# Patient Record
Sex: Female | Born: 1975 | Race: White | Hispanic: No | Marital: Married | Smoking: Never smoker
Health system: Southern US, Community
[De-identification: ages and names within clinical notes are randomized; demographics above are authoritative.]

---

## 2017-08-25 ENCOUNTER — Emergency Department (HOSPITAL_COMMUNITY): Payer: Medicaid Other

## 2017-08-25 ENCOUNTER — Encounter (HOSPITAL_COMMUNITY): Payer: Self-pay | Admitting: Emergency Medicine

## 2017-08-25 ENCOUNTER — Emergency Department (HOSPITAL_COMMUNITY)
Admission: EM | Admit: 2017-08-25 | Discharge: 2017-08-25 | Disposition: A | Discharge status: Home / Self Care | Payer: Medicaid Other | Attending: Emergency Medicine | Admitting: Emergency Medicine

## 2017-08-25 DIAGNOSIS — R1032 Left lower quadrant pain: Secondary | ICD-10-CM | POA: Diagnosis present

## 2017-08-25 DIAGNOSIS — N839 Noninflammatory disorder of ovary, fallopian tube and broad ligament, unspecified: Secondary | ICD-10-CM | POA: Diagnosis not present

## 2017-08-25 DIAGNOSIS — N838 Other noninflammatory disorders of ovary, fallopian tube and broad ligament: Secondary | ICD-10-CM

## 2017-08-25 LAB — BASIC METABOLIC PANEL
ANION GAP: 12 (ref 5–15)
BUN: 9 mg/dL (ref 6–20)
CALCIUM: 9 mg/dL (ref 8.9–10.3)
CHLORIDE: 102 mmol/L (ref 101–111)
CO2: 25 mmol/L (ref 22–32)
CREATININE: 0.83 mg/dL (ref 0.44–1.00)
GFR calc Af Amer: >60 mL/min (ref >60)
GFR calc non Af Amer: >60 mL/min (ref >60)
GLUCOSE: 104 mg/dL — AB (ref 65–99)
Potassium: 3.8 mmol/L (ref 3.5–5.1)
Sodium: 139 mmol/L (ref 135–145)

## 2017-08-25 LAB — CBC WITH DIFFERENTIAL/PLATELET
Basophils Absolute: 0 10*3/uL (ref 0.0–0.1)
Basophils Relative: 0 %
Eosinophils Absolute: 0 10*3/uL (ref 0.0–0.7)
Eosinophils Relative: 0 %
HEMATOCRIT: 37.5 % (ref 36.0–46.0)
Hemoglobin: 11.7 g/dL — ABNORMAL LOW (ref 12.0–15.0)
LYMPHS PCT: 13 %
Lymphs Abs: 1.4 10*3/uL (ref 0.7–4.0)
MCH: 26.9 pg (ref 26.0–34.0)
MCHC: 31.2 g/dL (ref 30.0–36.0)
MCV: 86.2 fL (ref 78.0–100.0)
MONO ABS: 0.7 10*3/uL (ref 0.1–1.0)
MONOS PCT: 7 %
NEUTROS ABS: 8.5 10*3/uL — AB (ref 1.7–7.7)
Neutrophils Relative %: 80 %
Platelets: 378 10*3/uL (ref 150–400)
RBC: 4.35 MIL/uL (ref 3.87–5.11)
RDW: 14.3 % (ref 11.5–15.5)
WBC: 10.6 10*3/uL — ABNORMAL HIGH (ref 4.0–10.5)

## 2017-08-25 LAB — URINALYSIS, ROUTINE W REFLEX MICROSCOPIC
BACTERIA UA: NONE SEEN
Bilirubin Urine: NEGATIVE
Glucose, UA: NEGATIVE mg/dL
KETONES UR: NEGATIVE mg/dL
LEUKOCYTES UA: NEGATIVE
Nitrite: NEGATIVE
PH: 8 (ref 5.0–8.0)
Protein, ur: NEGATIVE mg/dL
Specific Gravity, Urine: 1.01 (ref 1.005–1.030)

## 2017-08-25 LAB — PREGNANCY, URINE: PREG TEST UR: NEGATIVE

## 2017-08-25 MED ORDER — ONDANSETRON HCL 4 MG PO TABS
4.0000 mg | ORAL_TABLET | Freq: Once | ORAL | Status: AC
  Administered 2017-08-25: 4 mg via ORAL
  Filled 2017-08-25: qty 1

## 2017-08-25 MED ORDER — TRAMADOL HCL 50 MG PO TABS: 50.0000 mg | ORAL_TABLET | Freq: Four times a day (QID) | ORAL | 0 refills | Status: DC | PRN

## 2017-08-25 MED ORDER — ONDANSETRON HCL 4 MG PO TABS: 4.0000 mg | ORAL_TABLET | Freq: Three times a day (TID) | ORAL | 0 refills | Status: DC | PRN

## 2017-08-25 MED ORDER — SODIUM CHLORIDE 0.9 % IV BOLUS (SEPSIS)
1000.0000 mL | Freq: Once | INTRAVENOUS | Status: AC
  Administered 2017-08-25: 1000 mL via INTRAVENOUS

## 2017-08-25 MED ORDER — TRAMADOL HCL 50 MG PO TABS
50.0000 mg | ORAL_TABLET | Freq: Once | ORAL | Status: AC
  Administered 2017-08-25: 50 mg via ORAL
  Filled 2017-08-25: qty 1

## 2017-08-25 NOTE — ED Triage Notes (Addendum)
Patient complaining of fever x 1 1/2 weeks and left flank pain since yesterday. Denies dysuria. Also complaining of menstrual period lasting longer than 1 week. Denies pain. States she went to [facility] and they took x-rays of abdomen. Patient has radiology disc with her.

## 2017-08-25 NOTE — Discharge Instructions (Signed)
Follow-up Monday to get your pelvic ultrasound as planned.  Then follow-up this week with your OB/GYN doctor.  Call your doctor this week to set up the appointment and tell them you are in the emergency department today we felt like you need to see them this week

## 2017-08-25 NOTE — ED Provider Notes (Signed)
[facility] EMERGENCY DEPARTMENT Provider Note   CSN: [id number] Arrival date & time: 08/25/17  1855     History   Chief Complaint Chief Complaint  Patient presents with  . Fever    HPI Barbara Ho is a 42 y.o. female.  Patient presents with feeling bad for about a week and a half some fever some nausea some left lower quadrant abdominal pain   The history is provided by the patient. No language interpreter was used.  Illness  This is a new problem. The current episode started more than 1 week ago. The problem occurs constantly. The problem has not changed since onset.Associated symptoms include abdominal pain. Pertinent negatives include no chest pain and no headaches. Nothing aggravates the symptoms. Nothing relieves the symptoms. She has tried nothing for the symptoms. The treatment provided no relief.    History reviewed. No pertinent past medical history.  There are no active problems to display for this patient.   History reviewed. No pertinent surgical history.  OB History    No data available       Home Medications    Prior to Admission medications   Medication Sig Start Date End Date Taking? Authorizing Provider  traMADol (ULTRAM) 50 MG tablet Take 1 tablet (50 mg total) by mouth every 6 (six) hours as needed. 08/25/17   Milton Ferguson, MD    Family History No family history on file.  Social History Social History   Tobacco Use  . Smoking status: Never Smoker  . Smokeless tobacco: Never Used  Substance Use Topics  . Alcohol use: No    Frequency: Never  . Drug use: No     Allergies   Patient has no allergy information on record.   Review of Systems Review of Systems  Constitutional: Negative for appetite change and fatigue.  HENT: Negative for congestion, ear discharge and sinus pressure.   Eyes: Negative for discharge.  Respiratory: Negative for cough.   Cardiovascular: Negative for chest pain.  Gastrointestinal: Positive for abdominal  pain. Negative for diarrhea.  Genitourinary: Negative for frequency and hematuria.  Musculoskeletal: Negative for back pain.  Skin: Negative for rash.  Neurological: Negative for seizures and headaches.  Psychiatric/Behavioral: Negative for hallucinations.     Physical Exam Updated Vital Signs BP 114/89 (BP Location: Right Arm)   Pulse 80   Temp 98.8 F (37.1 C) (Oral)   Resp 18   Ht 5\' 8"  (1.727 m)   Wt 59.9 kg (132 lb)   LMP 08/15/2017 Comment: patient recently had testing done x1 day agol..  per dr Marcille Barman patient is ok for exam negative test results  SpO2 97%   BMI 20.07 kg/m   Physical Exam  Constitutional: She is oriented to person, place, and time. She appears well-developed.  HENT:  Head: Normocephalic.  Eyes: Conjunctivae and EOM are normal. No scleral icterus.  Neck: Neck supple. No thyromegaly present.  Cardiovascular: Normal rate and regular rhythm. Exam reveals no gallop and no friction rub.  No murmur heard. Pulmonary/Chest: No stridor. She has no wheezes. She has no rales. She exhibits no tenderness.  Abdominal: She exhibits no distension. There is tenderness. There is no rebound.  Tender left lower quadrant  Musculoskeletal: Normal range of motion. She exhibits no edema.  Lymphadenopathy:    She has no cervical adenopathy.  Neurological: She is oriented to person, place, and time. She exhibits normal muscle tone. Coordination normal.  Skin: No rash noted. No erythema.  Psychiatric: She has a  normal mood and affect. Her behavior is normal.     ED Treatments / Results  Labs (all labs ordered are listed, but only abnormal results are displayed) Labs Reviewed  CBC WITH DIFFERENTIAL/PLATELET - Abnormal; Notable for the following components:      Result Value   WBC 10.6 (*)    Hemoglobin 11.7 (*)    Neutro Abs 8.5 (*)    All other components within normal limits  BASIC METABOLIC PANEL - Abnormal; Notable for the following components:   Glucose, Bld 104  (*)    All other components within normal limits  URINALYSIS, ROUTINE W REFLEX MICROSCOPIC - Abnormal; Notable for the following components:   Hgb urine dipstick LARGE (*)    Squamous Epithelial / LPF 0-5 (*)    All other components within normal limits  PREGNANCY, URINE    EKG  EKG Interpretation None       Radiology Ct Renal Stone Study  Result Date: 08/25/2017 CLINICAL DATA:  42 year old female with history of fever for 1 and half weeks and left-sided flank pain for the past 24 hours. No dysuria. EXAM: CT ABDOMEN AND PELVIS WITHOUT CONTRAST TECHNIQUE: Multidetector CT imaging of the abdomen and pelvis was performed following the standard protocol without IV contrast. COMPARISON:  None. FINDINGS: Lower chest: Trace bilateral pleural effusions lying dependently. Hepatobiliary: No definite cystic or solid hepatic lesions confidently identified on today's noncontrast CT examination. Unenhanced appearance of the gallbladder is normal. Pancreas: No definite pancreatic mass or peripancreatic fluid or inflammatory changes are noted on today's noncontrast CT examination. Spleen: Unremarkable. Adrenals/Urinary Tract: Tiny 2 mm nonobstructive calculi in the upper pole the left kidney and in the lower pole collecting system of the right kidney. No ureteral stones or bladder stones identified. Despite this, there is moderate left hydroureteronephrosis, which appears related to extrinsic compression on the distal third of the left ureter related to a large left adnexal mass (discussed below). Unenhanced appearance of the kidneys is otherwise unremarkable. Bilateral adrenal glands are normal in appearance. Urinary bladder is normal in appearance. Stomach/Bowel: Unenhanced appearance of the stomach is normal. No pathologic dilatation of small bowel or colon. Normal appendix. Vascular/Lymphatic: No atherosclerotic calcifications in the abdominal or pelvic vasculature. Multiple prominent borderline enlarged and  mildly enlarged left pelvic and retroperitoneal lymph nodes, largest of which is in the left para-aortic nodal station measuring 1 cm in short axis (axial image 29 of series 2). Reproductive: Uterus is retroverted. Right ovary is unremarkable in appearance. In the left adnexal region there is a large multilocular mass measuring 6.4 x 8.4 x 9.4 cm (axial image 61 of series 2 and coronal image 47 of series 5). This lesion appears to have thickened walls in a thick internal septation with a small amount of calcifications. Other: No significant volume of ascites.  No pneumoperitoneum. Musculoskeletal: Small Tarlov cyst in the right side of the sacrum incidentally noted. There are no aggressive appearing lytic or blastic lesions noted in the visualized portions of the skeleton. IMPRESSION: 1. 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 This is exerting significant mass effect upon the distal third of the left ureter resulting in moderate proximal left hydroureteronephrosis. Further evaluation with pelvic ultrasound is strongly recommended to assess for potential ovarian malignancy. 2. Nonobstructive calculi in the collecting systems of both kidneys measuring up to 2 mm in the lower pole of the right kidney and upper pole the left kidney. No ureteral stones. Electronically Signed   By: Quillian Quince  Entrikin M.D.   On: 08/25/2017 22:00    Procedures Procedures (including critical care time)  Medications Ordered in ED Medications  sodium chloride 0.9 % bolus 1,000 mL (1,000 mLs Intravenous New Bag/Given 08/25/17 2126)     Initial Impression / Assessment and Plan / ED Course  I have reviewed the triage vital signs and the nursing notes.  Pertinent labs & imaging results that were available during my care of the patient were reviewed by me and considered in my medical decision making (see chart for details).     Patient CT scan shows large ovarian mass.  On the left.  Patient already has  arrangements to get an ultrasound in 2 days and she will follow-up with her OB/GYN doctor this week.  Patient given some pain medicine to take in the meantime  Final Clinical Impressions(s) / ED Diagnoses   Final diagnoses:  Ovarian mass, left    ED Discharge Orders        Ordered    traMADol (ULTRAM) 50 MG tablet  Every 6 hours PRN     08/25/17 2215       Milton Ferguson, MD 08/25/17 2223

## 2018-04-26 ENCOUNTER — Emergency Department (HOSPITAL_COMMUNITY): Payer: Self-pay

## 2018-04-26 ENCOUNTER — Other Ambulatory Visit: Payer: Self-pay

## 2018-04-26 ENCOUNTER — Encounter (HOSPITAL_COMMUNITY): Payer: Self-pay

## 2018-04-26 ENCOUNTER — Emergency Department (HOSPITAL_COMMUNITY)
Admission: EM | Admit: 2018-04-26 | Discharge: 2018-04-26 | Disposition: A | Discharge status: Home / Self Care | Payer: Self-pay | Attending: Emergency Medicine | Admitting: Emergency Medicine

## 2018-04-26 DIAGNOSIS — S7012XA Contusion of left thigh, initial encounter: Secondary | ICD-10-CM

## 2018-04-26 DIAGNOSIS — S298XXA Other specified injuries of thorax, initial encounter: Secondary | ICD-10-CM

## 2018-04-26 DIAGNOSIS — S161XXA Strain of muscle, fascia and tendon at neck level, initial encounter: Secondary | ICD-10-CM

## 2018-04-26 DIAGNOSIS — S3991XA Unspecified injury of abdomen, initial encounter: Secondary | ICD-10-CM

## 2018-04-26 DIAGNOSIS — M549 Dorsalgia, unspecified: Secondary | ICD-10-CM

## 2018-04-26 DIAGNOSIS — Y9389 Activity, other specified: Secondary | ICD-10-CM | POA: Insufficient documentation

## 2018-04-26 DIAGNOSIS — D259 Leiomyoma of uterus, unspecified: Secondary | ICD-10-CM

## 2018-04-26 DIAGNOSIS — S060X1A Concussion with loss of consciousness of 30 minutes or less, initial encounter: Secondary | ICD-10-CM

## 2018-04-26 DIAGNOSIS — R200 Anesthesia of skin: Secondary | ICD-10-CM | POA: Insufficient documentation

## 2018-04-26 DIAGNOSIS — Y998 Other external cause status: Secondary | ICD-10-CM | POA: Insufficient documentation

## 2018-04-26 DIAGNOSIS — Y92410 Unspecified street and highway as the place of occurrence of the external cause: Secondary | ICD-10-CM | POA: Insufficient documentation

## 2018-04-26 LAB — RAPID URINE DRUG SCREEN, HOSP PERFORMED
AMPHETAMINES: NOT DETECTED
BARBITURATES: NOT DETECTED
BENZODIAZEPINES: NOT DETECTED
COCAINE: NOT DETECTED
Opiates: NOT DETECTED
TETRAHYDROCANNABINOL: NOT DETECTED

## 2018-04-26 LAB — COMPREHENSIVE METABOLIC PANEL
ALBUMIN: 4 g/dL (ref 3.5–5.0)
ALK PHOS: 47 U/L (ref 38–126)
ALT: 16 U/L (ref 0–44)
ANION GAP: 8 (ref 5–15)
AST: 22 U/L (ref 15–41)
BUN: 13 mg/dL (ref 6–20)
CALCIUM: 8.5 mg/dL — AB (ref 8.9–10.3)
CO2: 21 mmol/L — AB (ref 22–32)
Chloride: 110 mmol/L (ref 98–111)
Creatinine, Ser: 0.72 mg/dL (ref 0.44–1.00)
GFR calc non Af Amer: >60 mL/min (ref >60)
Glucose, Bld: 97 mg/dL (ref 70–99)
POTASSIUM: 3.5 mmol/L (ref 3.5–5.1)
Sodium: 139 mmol/L (ref 135–145)
Total Bilirubin: 0.5 mg/dL (ref 0.3–1.2)
Total Protein: 7.3 g/dL (ref 6.5–8.1)

## 2018-04-26 LAB — I-STAT BETA HCG BLOOD, ED (MC, WL, AP ONLY)

## 2018-04-26 LAB — SAMPLE TO BLOOD BANK

## 2018-04-26 LAB — CBC
HCT: 29.4 % — ABNORMAL LOW (ref 36.0–46.0)
HEMOGLOBIN: 9 g/dL — AB (ref 12.0–15.0)
MCH: 27.5 pg (ref 26.0–34.0)
MCHC: 30.6 g/dL (ref 30.0–36.0)
MCV: 89.9 fL (ref 80.0–100.0)
NRBC: 0 % (ref 0.0–0.2)
PLATELETS: 277 10*3/uL (ref 150–400)
RBC: 3.27 MIL/uL — AB (ref 3.87–5.11)
RDW: 13.5 % (ref 11.5–15.5)
WBC: 6.5 10*3/uL (ref 4.0–10.5)

## 2018-04-26 LAB — URINALYSIS, ROUTINE W REFLEX MICROSCOPIC
Bilirubin Urine: NEGATIVE
Glucose, UA: NEGATIVE mg/dL
Hgb urine dipstick: NEGATIVE
KETONES UR: NEGATIVE mg/dL
LEUKOCYTES UA: NEGATIVE
NITRITE: NEGATIVE
PROTEIN: NEGATIVE mg/dL
Specific Gravity, Urine: 1.043 — ABNORMAL HIGH (ref 1.005–1.030)
pH: 5 (ref 5.0–8.0)

## 2018-04-26 LAB — PROTIME-INR
INR: 0.97
Prothrombin Time: 12.8 seconds (ref 11.4–15.2)

## 2018-04-26 LAB — ETHANOL

## 2018-04-26 MED ORDER — KETOROLAC TROMETHAMINE 30 MG/ML IJ SOLN
30.0000 mg | Freq: Once | INTRAMUSCULAR | Status: AC
  Administered 2018-04-26: 30 mg via INTRAVENOUS
  Filled 2018-04-26: qty 1

## 2018-04-26 MED ORDER — TETANUS-DIPHTH-ACELL PERTUSSIS 5-2.5-18.5 LF-MCG/0.5 IM SUSP: 0.5000 mL | Freq: Once | INTRAMUSCULAR | Status: DC

## 2018-04-26 MED ORDER — HYDROCODONE-ACETAMINOPHEN 5-325 MG PO TABS: 1.0000 | ORAL_TABLET | Freq: Four times a day (QID) | ORAL | 0 refills | Status: AC | PRN

## 2018-04-26 MED ORDER — FENTANYL CITRATE (PF) 100 MCG/2ML IJ SOLN
50.0000 ug | Freq: Once | INTRAMUSCULAR | Status: AC
  Administered 2018-04-26: 50 ug via INTRAVENOUS
  Filled 2018-04-26: qty 2

## 2018-04-26 MED ORDER — ONDANSETRON HCL 4 MG/2ML IJ SOLN
4.0000 mg | Freq: Once | INTRAMUSCULAR | Status: AC
  Administered 2018-04-26: 4 mg via INTRAVENOUS
  Filled 2018-04-26: qty 2

## 2018-04-26 MED ORDER — ONDANSETRON 8 MG PO TBDP: 8.0000 mg | ORAL_TABLET | Freq: Three times a day (TID) | ORAL | 0 refills | Status: AC | PRN

## 2018-04-26 MED ORDER — HYDROCODONE-ACETAMINOPHEN 5-325 MG PO TABS
1.0000 | ORAL_TABLET | Freq: Once | ORAL | Status: AC
  Administered 2018-04-26: 1 via ORAL
  Filled 2018-04-26: qty 1

## 2018-04-26 MED ORDER — SODIUM CHLORIDE 0.9 % IV BOLUS (SEPSIS)
1000.0000 mL | Freq: Once | INTRAVENOUS | Status: AC
  Administered 2018-04-26: 1000 mL via INTRAVENOUS

## 2018-04-26 MED ORDER — IOPAMIDOL (ISOVUE-300) INJECTION 61%
100.0000 mL | Freq: Once | INTRAVENOUS | Status: AC | PRN
  Administered 2018-04-26: 100 mL via INTRAVENOUS

## 2018-04-26 MED ORDER — BACITRACIN ZINC 500 UNIT/GM EX OINT
TOPICAL_OINTMENT | CUTANEOUS | Status: AC
  Administered 2018-04-26: 1
  Filled 2018-04-26: qty 0.9

## 2018-04-26 MED ORDER — ONDANSETRON 8 MG PO TBDP
8.0000 mg | ORAL_TABLET | Freq: Once | ORAL | Status: AC
  Administered 2018-04-26: 8 mg via ORAL
  Filled 2018-04-26: qty 1

## 2018-04-26 NOTE — ED Notes (Signed)
ED Provider at bedside. 
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
## 2018-04-26 NOTE — ED Notes (Signed)
Patient transported to CT 
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
